# Patient Record
Sex: Male | Born: 1975 | Race: White | Hispanic: No | Smoking: Current every day smoker
Health system: Southern US, Community
[De-identification: ages and names within clinical notes are randomized; demographics above are authoritative.]

## PROBLEM LIST (undated history)

## (undated) DIAGNOSIS — T07XXXA Unspecified multiple injuries, initial encounter: Secondary | ICD-10-CM

## (undated) DIAGNOSIS — I219 Acute myocardial infarction, unspecified: Secondary | ICD-10-CM

## (undated) HISTORY — PX: HAND SURGERY: SHX662

---

## 2013-09-10 ENCOUNTER — Encounter (HOSPITAL_COMMUNITY): Payer: Self-pay | Admitting: Emergency Medicine

## 2013-09-10 ENCOUNTER — Emergency Department (HOSPITAL_COMMUNITY): Payer: Self-pay

## 2013-09-10 ENCOUNTER — Emergency Department (HOSPITAL_COMMUNITY)
Admission: EM | Admit: 2013-09-10 | Discharge: 2013-09-11 | Disposition: A | Discharge status: Home / Self Care | Payer: Self-pay | Attending: Emergency Medicine | Admitting: Emergency Medicine

## 2013-09-10 DIAGNOSIS — S59909A Unspecified injury of unspecified elbow, initial encounter: Secondary | ICD-10-CM | POA: Insufficient documentation

## 2013-09-10 DIAGNOSIS — Y929 Unspecified place or not applicable: Secondary | ICD-10-CM | POA: Insufficient documentation

## 2013-09-10 DIAGNOSIS — M25532 Pain in left wrist: Secondary | ICD-10-CM

## 2013-09-10 DIAGNOSIS — Y9389 Activity, other specified: Secondary | ICD-10-CM | POA: Insufficient documentation

## 2013-09-10 DIAGNOSIS — Z8781 Personal history of (healed) traumatic fracture: Secondary | ICD-10-CM | POA: Insufficient documentation

## 2013-09-10 DIAGNOSIS — S0083XA Contusion of other part of head, initial encounter: Secondary | ICD-10-CM | POA: Insufficient documentation

## 2013-09-10 DIAGNOSIS — IMO0002 Reserved for concepts with insufficient information to code with codable children: Secondary | ICD-10-CM | POA: Insufficient documentation

## 2013-09-10 DIAGNOSIS — S0003XA Contusion of scalp, initial encounter: Secondary | ICD-10-CM | POA: Insufficient documentation

## 2013-09-10 DIAGNOSIS — S1093XA Contusion of unspecified part of neck, initial encounter: Secondary | ICD-10-CM

## 2013-09-10 DIAGNOSIS — I252 Old myocardial infarction: Secondary | ICD-10-CM | POA: Insufficient documentation

## 2013-09-10 DIAGNOSIS — K0889 Other specified disorders of teeth and supporting structures: Secondary | ICD-10-CM | POA: Insufficient documentation

## 2013-09-10 DIAGNOSIS — R634 Abnormal weight loss: Secondary | ICD-10-CM | POA: Insufficient documentation

## 2013-09-10 DIAGNOSIS — S6990XA Unspecified injury of unspecified wrist, hand and finger(s), initial encounter: Principal | ICD-10-CM

## 2013-09-10 DIAGNOSIS — S59919A Unspecified injury of unspecified forearm, initial encounter: Principal | ICD-10-CM

## 2013-09-10 DIAGNOSIS — Z9889 Other specified postprocedural states: Secondary | ICD-10-CM | POA: Insufficient documentation

## 2013-09-10 DIAGNOSIS — Z87828 Personal history of other (healed) physical injury and trauma: Secondary | ICD-10-CM | POA: Insufficient documentation

## 2013-09-10 DIAGNOSIS — F17200 Nicotine dependence, unspecified, uncomplicated: Secondary | ICD-10-CM | POA: Insufficient documentation

## 2013-09-10 HISTORY — DX: Unspecified multiple injuries, initial encounter: T07.XXXA

## 2013-09-10 HISTORY — DX: Acute myocardial infarction, unspecified: I21.9
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
## 2013-09-11 ENCOUNTER — Emergency Department (HOSPITAL_COMMUNITY): Payer: Self-pay

## 2013-09-11 LAB — CBC WITH DIFFERENTIAL/PLATELET
BASOS ABS: 0.1 10*3/uL (ref 0.0–0.1)
Basophils Relative: 1 % (ref 0–1)
Eosinophils Absolute: 0.7 10*3/uL (ref 0.0–0.7)
Eosinophils Relative: 8 % — ABNORMAL HIGH (ref 0–5)
HEMATOCRIT: 39.5 % (ref 39.0–52.0)
HEMOGLOBIN: 14 g/dL (ref 13.0–17.0)
LYMPHS PCT: 36 % (ref 12–46)
Lymphs Abs: 3 10*3/uL (ref 0.7–4.0)
MCH: 32.9 pg (ref 26.0–34.0)
MCHC: 35.4 g/dL (ref 30.0–36.0)
MCV: 92.7 fL (ref 78.0–100.0)
MONO ABS: 0.6 10*3/uL (ref 0.1–1.0)
Monocytes Relative: 8 % (ref 3–12)
NEUTROS ABS: 4 10*3/uL (ref 1.7–7.7)
Neutrophils Relative %: 47 % (ref 43–77)
Platelets: 252 10*3/uL (ref 150–400)
RBC: 4.26 MIL/uL (ref 4.22–5.81)
RDW: 12.1 % (ref 11.5–15.5)
WBC: 8.4 10*3/uL (ref 4.0–10.5)

## 2013-09-11 LAB — BASIC METABOLIC PANEL
ANION GAP: 12 (ref 5–15)
BUN: 9 mg/dL (ref 6–23)
CO2: 29 meq/L (ref 19–32)
CREATININE: 0.77 mg/dL (ref 0.50–1.35)
Calcium: 9 mg/dL (ref 8.4–10.5)
Chloride: 101 mEq/L (ref 96–112)
GFR calc Af Amer: >90 mL/min (ref >90)
GFR calc non Af Amer: >90 mL/min (ref >90)
Glucose, Bld: 87 mg/dL (ref 70–99)
Potassium: 4 mEq/L (ref 3.7–5.3)
Sodium: 142 mEq/L (ref 137–147)

## 2013-09-11 MED ORDER — HYDROCODONE-ACETAMINOPHEN 5-325 MG PO TABS
1.0000 | ORAL_TABLET | Freq: Once | ORAL | Status: AC
  Administered 2013-09-11: 1 via ORAL
  Filled 2013-09-11: qty 1

## 2013-09-11 MED ORDER — HYDROCODONE-ACETAMINOPHEN 5-325 MG PO TABS: ORAL_TABLET | ORAL | Status: DC

## 2013-09-11 NOTE — ED Provider Notes (Signed)
Medical screening examination/treatment/procedure(s) were performed by non-physician practitioner and as supervising physician I was immediately available for consultation/collaboration.   EKG Interpretation None        Mercades Bajaj L Perfecto Purdy, MD 09/11/13 0223 

## 2013-09-11 NOTE — Discharge Instructions (Signed)
Do not hesitate to return to the emergency room for any new, worsening or concerning symptoms. ° °Please obtain primary care using resource guide below. But the minute you were seen in the emergency room and that they will need to obtain records for further outpatient management. ° ° ° °Emergency Department Resource Guide °1) Find a Doctor and Pay Out of Pocket °Although you won't have to find out who is covered by your insurance plan, it is a good idea to ask around and get recommendations. You will then need to call the office and see if the doctor you have chosen will accept you as a new patient and what types of options they offer for patients who are self-pay. Some doctors offer discounts or will set up payment plans for their patients who do not have insurance, but you will need to ask so you aren't surprised when you get to your appointment. ° °2) Contact Your Local Health Department °Not all health departments have doctors that can see patients for sick visits, but many do, so it is worth a call to see if yours does. If you don't know where your local health department is, you can check in your phone book. The CDC also has a tool to help you locate your state's health department, and many state websites also have listings of all of their local health departments. ° °3) Find a Walk-in Clinic °If your illness is not likely to be very severe or complicated, you may want to try a walk in clinic. These are popping up all over the country in pharmacies, drugstores, and shopping centers. They're usually staffed by nurse practitioners or physician assistants that have been trained to treat common illnesses and complaints. They're usually fairly quick and inexpensive. However, if you have serious medical issues or chronic medical problems, these are probably not your best option. ° °No Primary Care Doctor: °- Call [facility] at  832-8000 - they can help you locate a primary care doctor that  accepts your  insurance, provides certain services, etc. °- Physician Referral Service- 1-800-533-3463 ° °Chronic Pain Problems: °Organization         Address  Phone   Notes  °[facility]  (336) 297-2271 Patients need to be referred by their primary care doctor.  ° °Medication Assistance: °Organization         Address  Phone   Notes  °[place] [facility] [address] °[place], [place] [address] (336) 641-8030 --Must be a resident of [place] °-- Must have NO insurance coverage whatsoever (no Medicaid/ Medicare, etc.) °-- The pt. MUST have a primary care doctor that directs their care regularly and follows them in the community °  °[facility]  (866) 331-1348   °[employer]  (888) 892-1162   ° °Agencies that provide inexpensive medical care: °Organization         Address  Phone   Notes  °[facility]  (336) 832-8035   °[facility]    (336) 832-7272   °[facility] [address] °[place], [place] [address] (336) 832-4777   °[facility] [address], °[place] (336) 271-4999   °[facility]    (336) 373-0678   °[facility]    (336) 272-1050   °[facility] ° [address], [place] Phone:  (336) 832-4444, Fax:  (336) 832-4440 Hours of Operation:  9 am -   6 pm, M-F.  Also accepts Medicaid/Medicare and self-pay.  °[facility] ° [address], [place] Phone: (336) 832-3150, Fax: (336) 832-3151. Hours of Operation:  8:30 am - 5:30 pm, M-F.  Also accepts Medicaid and self-pay.  °[facility] [place] [address], [place] Phone: (336) 878-6027   °[facility] [address], [place], [place] (336)723-1848, Ext. 123 Mondays & Thursdays: 7-9 AM.  First 15 patients are seen on a first come, first serve basis. °  ° °Medicaid-accepting [place] Providers: ° °Organization          Address  Phone   Notes  °[facility] [address], [place] (336) 641-2100 Also accepts self-pay patients.  °[facility] [address], [place] ° (336) 856-9996   °[facility] [address], [place] (336) 288-8857   °[facility] [address] [place] [address], [place] (336) 299-7000   °Veita Bland [address], [place]  ° (336) 373-1557 Only accepts [place] Access Medicaid patients after they have their name applied to their card.  ° °Self-Pay (no insurance) in [place]: ° °Organization         Address  Phone   Notes  °Sickle Cell Patients, [facility] [address], [place] (336) 832-1970   °[facility] [address], [place] (336) 832-4400   °[facility] ° [address], [place] (336) 992-4800   °[facility]/Dr. Osei-Bonsu ° [address] [place] [address], [place] or [address], [place] (336) 841-8500 Phone number for both [place] and [place] locations is the same.  °[facility] [address], [place] (336) 299-0000   °[facility] [address] [place] [address], [place] or [address] (336) 852-7530 °(336) 878-2260   °[facility] [address], [place] (336) 350-1642, phone; (336) 294-5005, fax Sees patients 1st and 3rd Saturday of every month.  Must not qualify for public or private insurance (i.e. Medicaid, Medicare, [place] Health Choice, Veterans' Benefits) • Household income should be no more than 200% of the poverty level •The clinic cannot treat you if you are pregnant or think you are pregnant • Sexually transmitted diseases are not treated at the clinic.  ° ° °Dental Care: °Organization         Address  Phone  Notes  °[facility] [address],  [place] (336) 641-6152 Accepts children up to age 21 who are enrolled in Medicaid or [place] Health Choice; pregnant women with a Medicaid card; and children who have applied for Medicaid or [place] Health Choice, but were declined, whose parents can pay a reduced fee at time of service.  °[facility] [place]  [address], [place] (336) 641-7733 Accepts children up to age 21 who are enrolled in Medicaid or [place] Health Choice; pregnant women with a Medicaid card; and children who have applied for Medicaid or [place] Health Choice, but were declined, whose parents can pay a reduced fee at time of service.  °[facility] ° [address], [place] (336) 641-4533 Patients are seen by appointment only. Walk-ins are not accepted. [facility] will see patients 18 years of age and   older. °Monday - Tuesday (8am-5pm) °Most Wednesdays (8:30-5pm) °$30 per visit, cash only  °[facility] ° [address], [place] (336) 641-4533 Patients are seen by appointment only. Walk-ins are not accepted. [facility] will see patients 18 years of age and older. °One Wednesday Evening (Monthly: Volunteer Based).  $30 per visit, cash only  °[facility]  (919) 537-3737 for adults; Children under age 4, call Graduate Pediatric Dentistry at (919) [phone]. Children aged 4-14, please call (919) [phone] to request a pediatric application. ° Dental services are provided in all areas of dental care including fillings, crowns and bridges, complete and partial dentures, implants, gum treatment, root canals, and extractions. Preventive care is also provided. Treatment is provided to both adults and children. °Patients are selected via a lottery and there is often a waiting list. °  °[facility] [address], °[place] ° (336) 763-8833 [email or web address] °  °[facility] [address], [place], [place]  (336)723-1848, Ext. 123 Second and Fourth Thursday of each month, opens at 6:30 AM; Clinic ends at 9 AM.  Patients are seen on a first-come first-served basis, and a limited number are seen during each clinic.  ° °[facility] ° [address], [place], [place] (336) 723-7904   Eligibility Requirements °You must have lived in [place], [place], or [place] counties for at least the last three months. °  You cannot be eligible for state or federal sponsored healthcare insurance, including Veterans Administration, Medicaid, or Medicare. °  You generally cannot be eligible for healthcare insurance through your employer.  °  How to apply: °Eligibility screenings are held every Tuesday and Wednesday afternoon from 1:00 pm until 4:00 pm. You do not need an appointment for the interview!  °[facility] [address], [place], [place] 336-631-2330   °[facility]  336-342-8273   °[facility]  336-703-3100   °[facility]  336-570-6415   ° °Behavioral Health Resources in the Community: °Intensive Outpatient Programs °Organization         Address  Phone  Notes  °[facility] [address], [place], [place] 336-878-6098   °[facility] [address], [place], [place] 336-832-9800   °[facility] [address], [place], [place] ° 336-882-2125   °[facility] [address],  °[place], [place] 1-800-853-5163 or 336-641-4981   °Substance Abuse Resources °Organization         Address  Phone  Notes  °[facility]  336-882-2125   °[facility]  336-784-9470   °[facility]  336-285-9073   °[facility]  336-845-3988   °Residential & Outpatient Substance Abuse Program  1-800-659-3381   °Psychological Services °Organization         Address  Phone  Notes  °[facility]  336- 832-9600   °[facility]  336- 378-7881    °[facility] [address], [place] 1-800-853-5163 or 336-641-4981   ° °Mobile Crisis Teams °Organization         Address  Phone  Notes  °[facility], Mobile Crisis Care Unit  1-877-626-1772   °Assertive °[facility] ° [address] [place], [place] 336-834-9664   °Sharon DeEsch [address] °[place] 336-554-5454   ° °Self-Help/Support Groups °Organization         Address    Phone             Notes  °[facility] [place] - variety of support groups  336- 373-1402 Call for more information  °Narcotics Anonymous (NA), [facility] [address], °[place]  2 meetings at this location  ° °Residential Treatment Programs °Organization         Address  Phone  Notes  °[facility] [address],    °[place]  1-866-801-8205   °[facility] ° [address], [place], [place] 704-293-8524   °[facility] [address], [place] 336-845-3988 Admissions: 8am-3pm M-F  °[facility] [address]    °[place], [place] 336-841-1104   °[facility] [address], [place], [place] 336-379-7146   °[facility] [address]  °[place], [place] 336-285-9073   °[facility] Intensive Outpatient [address], [place], [place] 336-852-3033   °[facility] [address]  °[place], [place] 1-877-615-2722 or 336-784-9470   °[facility] ([facility] [address] [place], [place] 336-227-7417 Accepts Medicaid  °[facility] [address]  °[place] 1-800-659-3381 Substance Abuse/Addiction Treatment  ° °[place] Behavioral Health Resources °Organization         Address  Phone  Notes  °[facility]  (888) 581-9988   °Julie Brannon, PhD [address] [place], [place]   (336) 349-5553 or (336) 951-0000   °[facility]   [address] °[place], [place] (336) 349-4454   °[facility] [address], [place], [place] (336) 342-8316 Insurance/Medicaid/sponsorship through [facility]  °[facility] [address]                                    [place], [place] (336) 342-8316 Therapy/tele-psych/case  °[facility] [address]  ° [place], [place] (336) 349-2233    °Dr. Arfeen  (336) 349-4544   °[facility] [place]  [employer] [place] [facility] 1) [address], [place] °2) [address], [place] °3)  [address], [place] (336) 349-3220 °(336) 342-7768 ° °(336) 342-8140   °[place] [facility] (336) 342-1394 or (336) 342-3537 (After Hours)    ° ° ° °

## 2014-02-11 ENCOUNTER — Emergency Department (HOSPITAL_COMMUNITY): Payer: Self-pay

## 2014-02-11 ENCOUNTER — Emergency Department (HOSPITAL_COMMUNITY)
Admission: EM | Admit: 2014-02-11 | Discharge: 2014-02-11 | Disposition: A | Discharge status: Home / Self Care | Payer: Self-pay | Attending: Emergency Medicine | Admitting: Emergency Medicine

## 2014-02-11 ENCOUNTER — Encounter (HOSPITAL_COMMUNITY): Payer: Self-pay | Admitting: *Deleted

## 2014-02-11 DIAGNOSIS — I252 Old myocardial infarction: Secondary | ICD-10-CM | POA: Insufficient documentation

## 2014-02-11 DIAGNOSIS — T1490XA Injury, unspecified, initial encounter: Secondary | ICD-10-CM

## 2014-02-11 DIAGNOSIS — R52 Pain, unspecified: Secondary | ICD-10-CM

## 2014-02-11 DIAGNOSIS — S92415A Nondisplaced fracture of proximal phalanx of left great toe, initial encounter for closed fracture: Secondary | ICD-10-CM | POA: Insufficient documentation

## 2014-02-11 DIAGNOSIS — S92402A Displaced unspecified fracture of left great toe, initial encounter for closed fracture: Secondary | ICD-10-CM

## 2014-02-11 DIAGNOSIS — Y9389 Activity, other specified: Secondary | ICD-10-CM | POA: Insufficient documentation

## 2014-02-11 DIAGNOSIS — Y9302 Activity, running: Secondary | ICD-10-CM | POA: Insufficient documentation

## 2014-02-11 DIAGNOSIS — Z79899 Other long term (current) drug therapy: Secondary | ICD-10-CM | POA: Insufficient documentation

## 2014-02-11 DIAGNOSIS — Y9289 Other specified places as the place of occurrence of the external cause: Secondary | ICD-10-CM | POA: Insufficient documentation

## 2014-02-11 DIAGNOSIS — Y998 Other external cause status: Secondary | ICD-10-CM | POA: Insufficient documentation

## 2014-02-11 DIAGNOSIS — Z720 Tobacco use: Secondary | ICD-10-CM | POA: Insufficient documentation

## 2014-02-11 DIAGNOSIS — W228XXA Striking against or struck by other objects, initial encounter: Secondary | ICD-10-CM | POA: Insufficient documentation

## 2014-02-11 MED ORDER — OXYCODONE-ACETAMINOPHEN 5-325 MG PO TABS
2.0000 | ORAL_TABLET | Freq: Once | ORAL | Status: AC
  Administered 2014-02-11: 2 via ORAL
  Filled 2014-02-11: qty 2

## 2014-02-11 MED ORDER — HYDROCODONE-ACETAMINOPHEN 5-325 MG PO TABS: ORAL_TABLET | ORAL | Status: DC

## 2014-02-11 MED ORDER — MELOXICAM 7.5 MG PO TABS: 7.5000 mg | ORAL_TABLET | Freq: Two times a day (BID) | ORAL | Status: AC | PRN

## 2014-02-11 NOTE — ED Notes (Signed)
Offered ice bag but pt refuses ice, states that he tried yesterday and it made pain worse
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
## 2014-02-11 NOTE — ED Notes (Signed)
Pt states girlfriend will drive pt home at discharge

## 2014-02-11 NOTE — ED Notes (Signed)
Pt verbalized understanding of no driving and to use caution within 4 hours of taking pain meds due to meds cause drowsiness 

## 2014-02-11 NOTE — ED Notes (Signed)
Injury to left great toe after running and hitting it on a stepping stone last night.

## 2014-03-07 ENCOUNTER — Encounter (HOSPITAL_COMMUNITY): Payer: Self-pay | Admitting: Emergency Medicine

## 2014-03-07 ENCOUNTER — Emergency Department (HOSPITAL_COMMUNITY)
Admission: EM | Admit: 2014-03-07 | Discharge: 2014-03-07 | Disposition: A | Discharge status: Home / Self Care | Payer: Self-pay | Attending: Emergency Medicine | Admitting: Emergency Medicine

## 2014-03-07 DIAGNOSIS — Z77098 Contact with and (suspected) exposure to other hazardous, chiefly nonmedicinal, chemicals: Secondary | ICD-10-CM

## 2014-03-07 DIAGNOSIS — Y9389 Activity, other specified: Secondary | ICD-10-CM | POA: Insufficient documentation

## 2014-03-07 DIAGNOSIS — T5494XA Toxic effect of unspecified corrosive substance, undetermined, initial encounter: Secondary | ICD-10-CM | POA: Insufficient documentation

## 2014-03-07 DIAGNOSIS — M549 Dorsalgia, unspecified: Secondary | ICD-10-CM | POA: Insufficient documentation

## 2014-03-07 DIAGNOSIS — Z720 Tobacco use: Secondary | ICD-10-CM | POA: Insufficient documentation

## 2014-03-07 DIAGNOSIS — Z8781 Personal history of (healed) traumatic fracture: Secondary | ICD-10-CM | POA: Insufficient documentation

## 2014-03-07 DIAGNOSIS — X58XXXA Exposure to other specified factors, initial encounter: Secondary | ICD-10-CM | POA: Insufficient documentation

## 2014-03-07 DIAGNOSIS — Y9289 Other specified places as the place of occurrence of the external cause: Secondary | ICD-10-CM | POA: Insufficient documentation

## 2014-03-07 DIAGNOSIS — T2662XA Corrosion of cornea and conjunctival sac, left eye, initial encounter: Secondary | ICD-10-CM | POA: Insufficient documentation

## 2014-03-07 DIAGNOSIS — T2661XA Corrosion of cornea and conjunctival sac, right eye, initial encounter: Secondary | ICD-10-CM | POA: Insufficient documentation

## 2014-03-07 DIAGNOSIS — Z791 Long term (current) use of non-steroidal anti-inflammatories (NSAID): Secondary | ICD-10-CM | POA: Insufficient documentation

## 2014-03-07 DIAGNOSIS — I252 Old myocardial infarction: Secondary | ICD-10-CM | POA: Insufficient documentation

## 2014-03-07 DIAGNOSIS — Y998 Other external cause status: Secondary | ICD-10-CM | POA: Insufficient documentation

## 2014-03-07 MED ORDER — FLUORESCEIN SODIUM 1 MG OP STRP
1.0000 | ORAL_STRIP | Freq: Once | OPHTHALMIC | Status: AC
  Administered 2014-03-07: 1 via OPHTHALMIC

## 2014-03-07 MED ORDER — NAPROXEN 500 MG PO TABS: 500.0000 mg | ORAL_TABLET | Freq: Two times a day (BID) | ORAL | Status: AC

## 2014-03-07 MED ORDER — HYDROCODONE-ACETAMINOPHEN 5-325 MG PO TABS: 1.0000 | ORAL_TABLET | Freq: Four times a day (QID) | ORAL | Status: AC | PRN

## 2014-03-07 MED ORDER — HYDROCODONE-ACETAMINOPHEN 5-325 MG PO TABS
1.0000 | ORAL_TABLET | Freq: Once | ORAL | Status: AC
Start: 2014-03-07 — End: 2014-03-07
  Administered 2014-03-07: 1 via ORAL
  Filled 2014-03-07: qty 1

## 2014-03-07 MED ORDER — TETRACAINE HCL 0.5 % OP SOLN
OPHTHALMIC | Status: AC
  Administered 2014-03-07: 2 [drp] via OPHTHALMIC
  Filled 2014-03-07: qty 2

## 2014-03-07 MED ORDER — FLUORESCEIN SODIUM 1 MG OP STRP
ORAL_STRIP | OPHTHALMIC | Status: AC
  Administered 2014-03-07: 1 via OPHTHALMIC
  Filled 2014-03-07: qty 1

## 2014-03-07 MED ORDER — TETRACAINE HCL 0.5 % OP SOLN
2.0000 [drp] | Freq: Once | OPHTHALMIC | Status: AC
  Administered 2014-03-07: 2 [drp] via OPHTHALMIC
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
## 2014-03-07 NOTE — ED Notes (Signed)
Patient given discharge instruction, verbalized understand. Patient ambulatory out of the department to waiting to wait on employee to pick him up

## 2014-03-07 NOTE — Discharge Instructions (Signed)
Workup for the eye without any significant injuries. Discussed with poison control. Chemical suggest be an irritant which and cause any long-term problems. Work note provided. Take the Naprosyn on a regular basis. Supplement with hydrocodone as needed for pain. Return for any new or worse symptoms or if not improving.
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
## 2014-05-03 ENCOUNTER — Ambulatory Visit (HOSPITAL_COMMUNITY)
Admission: RE | Admit: 2014-05-03 | Discharge: 2014-05-03 | Disposition: A | Discharge status: Home / Self Care | Payer: Self-pay | Source: Ambulatory Visit | Attending: Physician Assistant | Admitting: Physician Assistant

## 2014-05-03 ENCOUNTER — Other Ambulatory Visit (HOSPITAL_COMMUNITY): Payer: Self-pay | Admitting: Physician Assistant

## 2014-05-03 DIAGNOSIS — R634 Abnormal weight loss: Secondary | ICD-10-CM

## 2014-05-03 DIAGNOSIS — F17200 Nicotine dependence, unspecified, uncomplicated: Secondary | ICD-10-CM | POA: Insufficient documentation

## 2014-05-03 DIAGNOSIS — R222 Localized swelling, mass and lump, trunk: Secondary | ICD-10-CM

## 2014-05-03 DIAGNOSIS — J449 Chronic obstructive pulmonary disease, unspecified: Secondary | ICD-10-CM | POA: Insufficient documentation

## 2016-12-30 ENCOUNTER — Emergency Department (HOSPITAL_COMMUNITY): Payer: Self-pay

## 2016-12-30 ENCOUNTER — Encounter (HOSPITAL_COMMUNITY): Payer: Self-pay | Admitting: Emergency Medicine

## 2016-12-30 ENCOUNTER — Other Ambulatory Visit: Payer: Self-pay

## 2016-12-30 ENCOUNTER — Emergency Department (HOSPITAL_COMMUNITY)
Admission: EM | Admit: 2016-12-30 | Discharge: 2016-12-30 | Disposition: A | Discharge status: Home / Self Care | Payer: Self-pay | Attending: Emergency Medicine | Admitting: Emergency Medicine

## 2016-12-30 DIAGNOSIS — Z79899 Other long term (current) drug therapy: Secondary | ICD-10-CM | POA: Insufficient documentation

## 2016-12-30 DIAGNOSIS — I252 Old myocardial infarction: Secondary | ICD-10-CM | POA: Insufficient documentation

## 2016-12-30 DIAGNOSIS — M48061 Spinal stenosis, lumbar region without neurogenic claudication: Secondary | ICD-10-CM | POA: Insufficient documentation

## 2016-12-30 DIAGNOSIS — F17210 Nicotine dependence, cigarettes, uncomplicated: Secondary | ICD-10-CM | POA: Insufficient documentation

## 2016-12-30 DIAGNOSIS — M62830 Muscle spasm of back: Secondary | ICD-10-CM | POA: Insufficient documentation

## 2016-12-30 MED ORDER — HYDROCODONE-ACETAMINOPHEN 5-325 MG PO TABS
2.0000 | ORAL_TABLET | Freq: Once | ORAL | Status: AC
Start: 2016-12-30 — End: 2016-12-30
  Administered 2016-12-30: 2 via ORAL
  Filled 2016-12-30: qty 2

## 2016-12-30 MED ORDER — ONDANSETRON HCL 4 MG PO TABS
4.0000 mg | ORAL_TABLET | Freq: Once | ORAL | Status: AC
  Administered 2016-12-30: 4 mg via ORAL
  Filled 2016-12-30: qty 1

## 2016-12-30 MED ORDER — CYCLOBENZAPRINE HCL 10 MG PO TABS: 10.0000 mg | ORAL_TABLET | Freq: Three times a day (TID) | ORAL | 0 refills | Status: AC

## 2016-12-30 MED ORDER — IBUPROFEN 800 MG PO TABS
800.0000 mg | ORAL_TABLET | Freq: Once | ORAL | Status: AC
  Administered 2016-12-30: 800 mg via ORAL
  Filled 2016-12-30: qty 1

## 2016-12-30 MED ORDER — DIAZEPAM 5 MG PO TABS
10.0000 mg | ORAL_TABLET | Freq: Once | ORAL | Status: AC
  Administered 2016-12-30: 10 mg via ORAL
  Filled 2016-12-30: qty 2

## 2016-12-30 NOTE — ED Triage Notes (Signed)
Pt c/o lower back pain since yesterday and loading firewood. Pt c/o bilateral leg numbness at times.
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
## 2016-12-30 NOTE — Discharge Instructions (Signed)
Your vital signs are nonacute.  There are no gross neurologic deficits appreciated on your examination at this time.  The CT scan of your lumbar spine is negative for fracture or any malalignment.  There is a mild disc bulge at the L5-S1 area causing some mild foraminal stenosis.  I suspect that your symptoms are related to muscle strain involving your lower back area.  Please use a heating pad to the area.  Please be cautious not to overuse or burning herself.  Use Flexeril 3 times daily for spasm pain.  This medication may cause drowsiness.  Please do not drive, drink, operate machinery, or participate in activities requiring concentration when taking this medication.  Please use 600 mg of ibuprofen with breakfast, lunch, dinner, and at bedtime over the next few days for inflammation and pain.  Please see Dr. Romeo AppleHarrison for orthopedic evaluation if your symptoms are not resolving.

## 2017-05-11 ENCOUNTER — Other Ambulatory Visit: Payer: Self-pay

## 2017-05-11 ENCOUNTER — Emergency Department (HOSPITAL_COMMUNITY): Payer: Self-pay

## 2017-05-11 ENCOUNTER — Emergency Department (HOSPITAL_COMMUNITY)
Admission: EM | Admit: 2017-05-11 | Discharge: 2017-05-11 | Disposition: A | Discharge status: Home / Self Care | Payer: Self-pay | Attending: Emergency Medicine | Admitting: Emergency Medicine

## 2017-05-11 ENCOUNTER — Encounter (HOSPITAL_COMMUNITY): Payer: Self-pay | Admitting: *Deleted

## 2017-05-11 DIAGNOSIS — M546 Pain in thoracic spine: Secondary | ICD-10-CM

## 2017-05-11 DIAGNOSIS — M549 Dorsalgia, unspecified: Secondary | ICD-10-CM | POA: Insufficient documentation

## 2017-05-11 DIAGNOSIS — R52 Pain, unspecified: Secondary | ICD-10-CM

## 2017-05-11 DIAGNOSIS — F17210 Nicotine dependence, cigarettes, uncomplicated: Secondary | ICD-10-CM | POA: Insufficient documentation

## 2017-05-11 MED ORDER — CYCLOBENZAPRINE HCL 10 MG PO TABS
5.0000 mg | ORAL_TABLET | Freq: Once | ORAL | Status: AC
  Administered 2017-05-11: 5 mg via ORAL
  Filled 2017-05-11: qty 1

## 2017-05-11 MED ORDER — KETOROLAC TROMETHAMINE 30 MG/ML IJ SOLN
30.0000 mg | Freq: Once | INTRAMUSCULAR | Status: AC
  Administered 2017-05-11: 30 mg via INTRAMUSCULAR
  Filled 2017-05-11: qty 1
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
## 2017-05-11 NOTE — ED Triage Notes (Signed)
Pt c/o pain in between shoulder blades x 4 days; pt states he heard a "pop" sound when the pain started; pt states he can not get comfortable

## 2017-05-11 NOTE — ED Notes (Signed)
Patient put on his jacket and walked out of the room and stated "I can go home and hurt like this", I asked him if he wanted to stay and wait for his x-ray results or sign, he said no, EDP Horton informed.

## 2019-02-10 IMAGING — DX DG THORACIC SPINE 3V
3 series · 4 of 4 positions shown · non-contrast
Comparison: Chest x-ray 05/03/2014, 09/11/2013

CLINICAL DATA: Thoracic pain

EXAM:
THORACIC SPINE - 3 VIEWS

[Series 1: t-spine ap · 0.14mm/px · 2 of 2 slices shown]
[im 1/2]
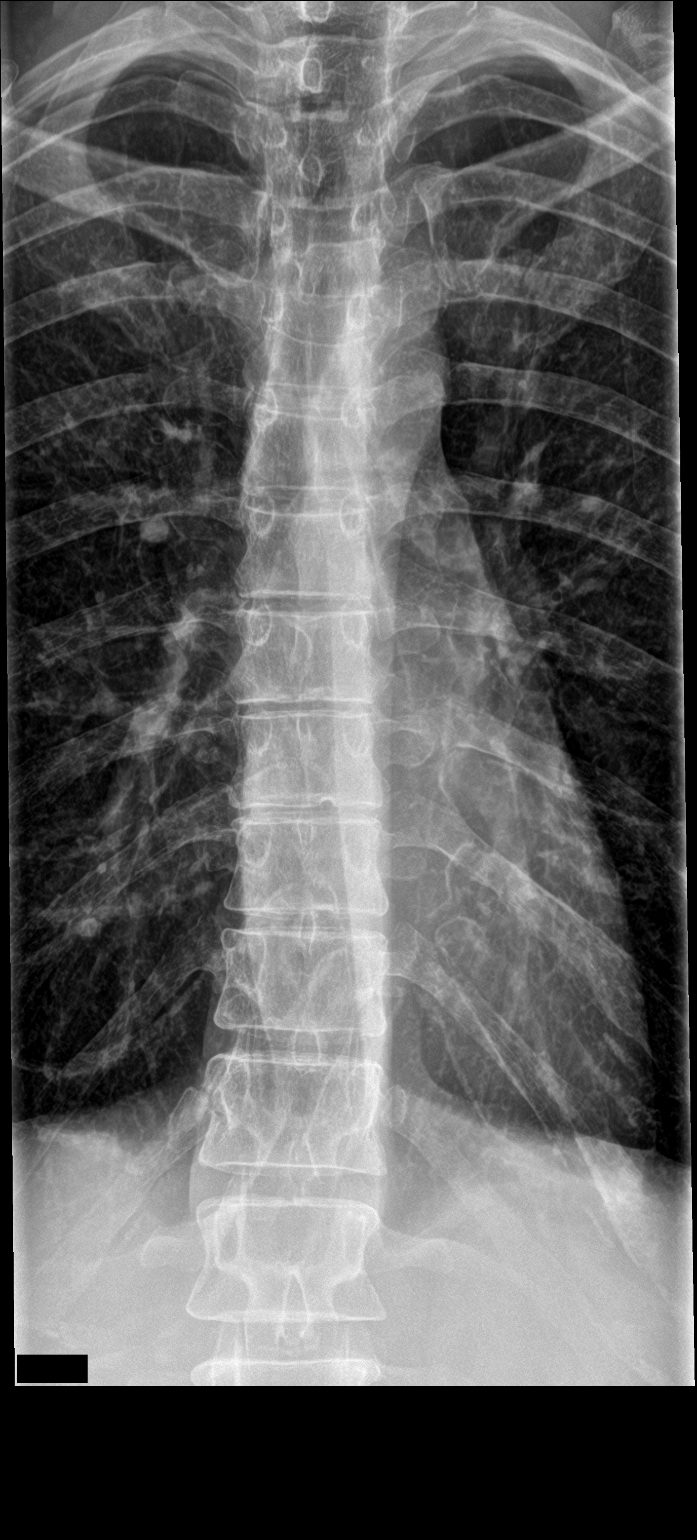
[im 2/2]
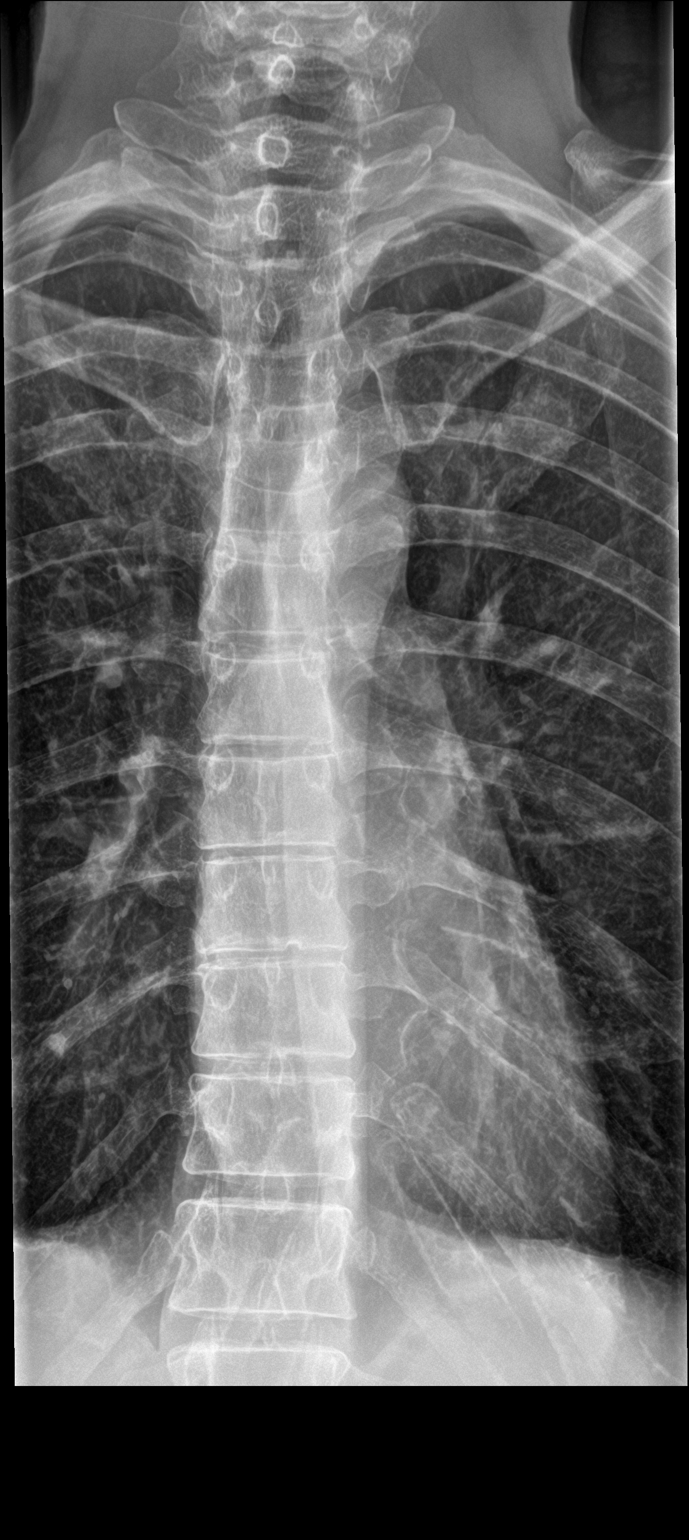

[t-spine lat]
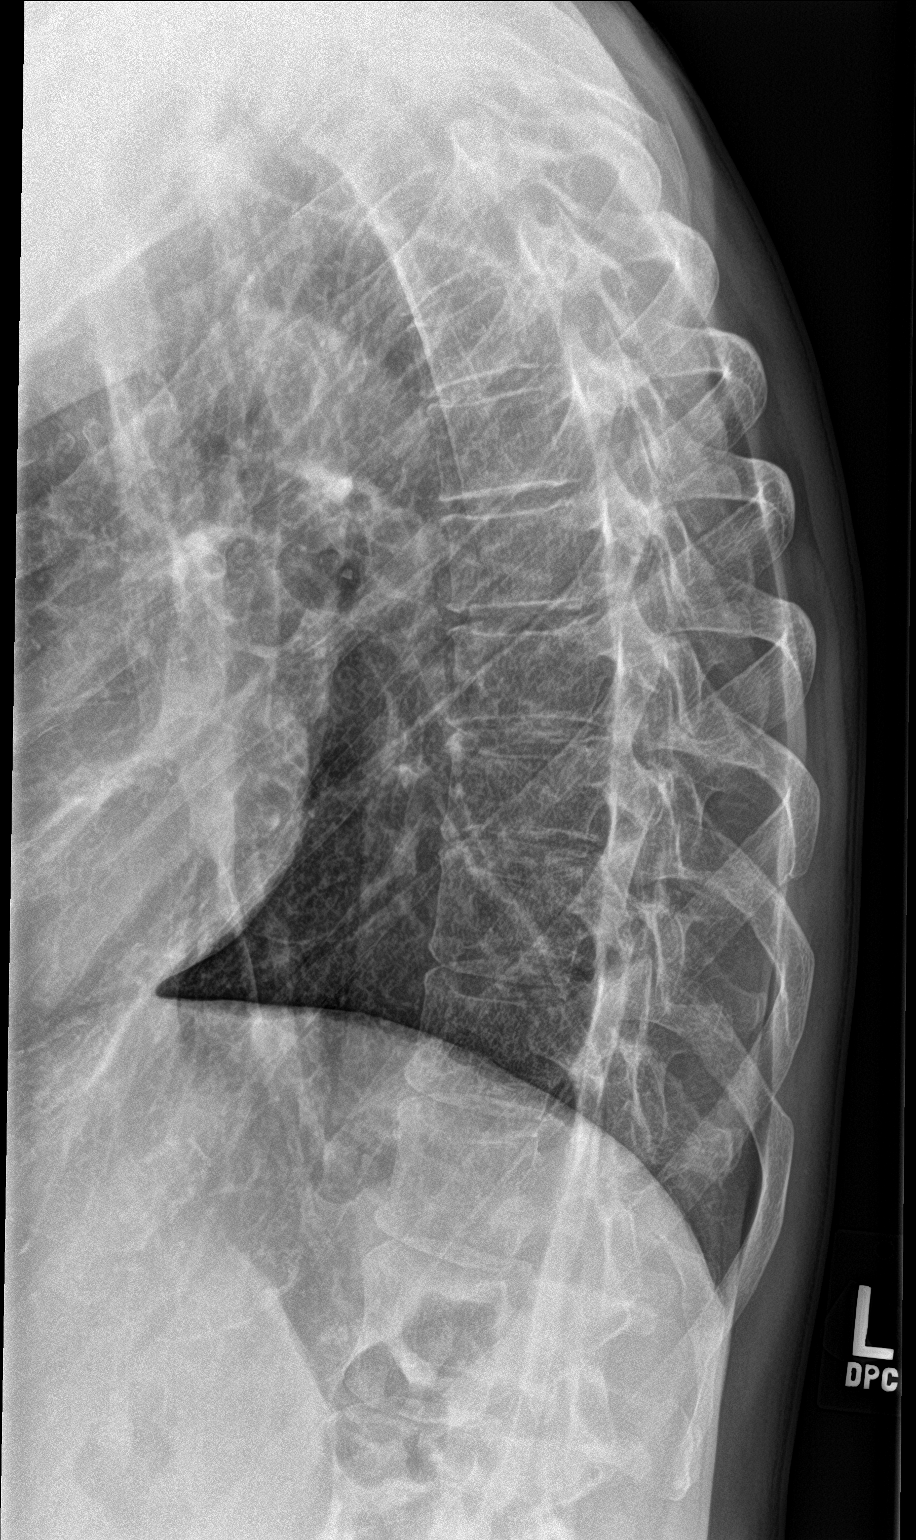

[t-spine swimmers]
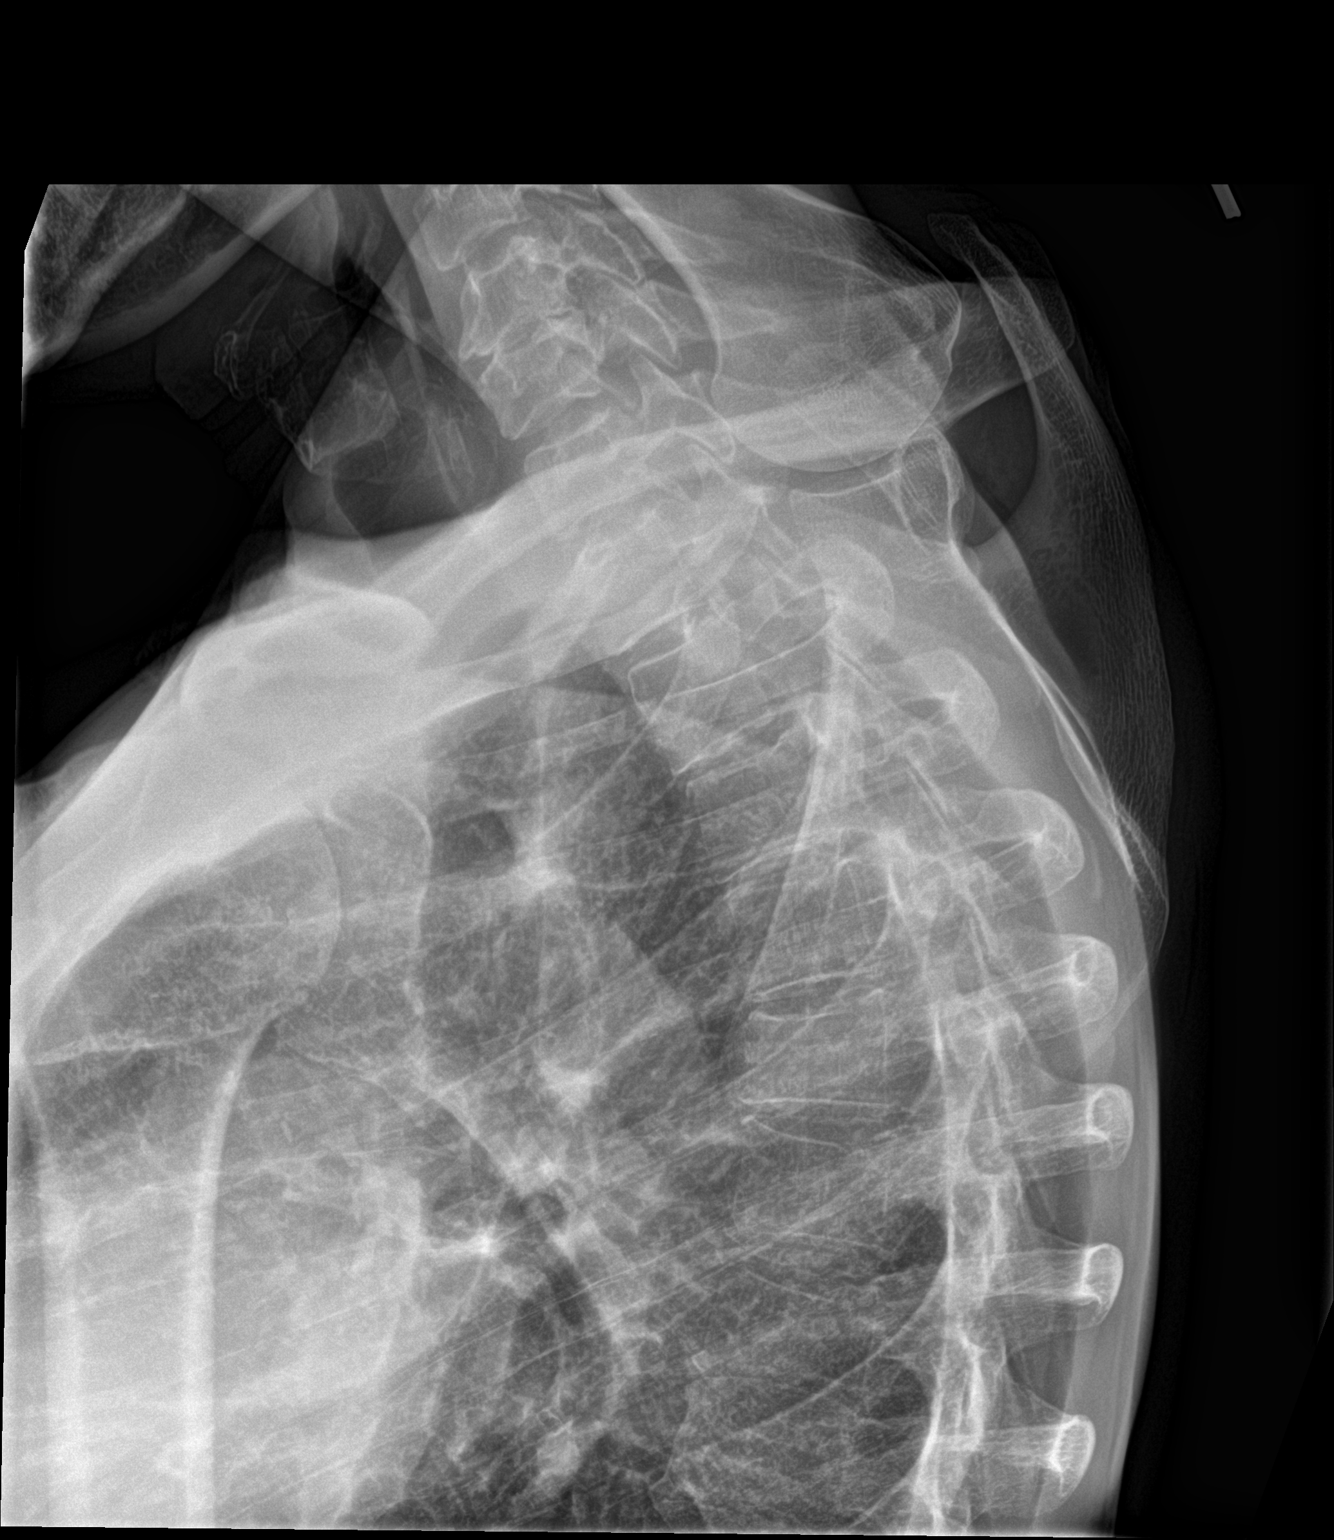

[4 of 4 positions shown; findings below may reference images not displayed]

FINDINGS: Thoracic alignment is within normal limits. Chronic compression of
T5. Disc spaces are normal
IMPRESSION: No acute osseous abnormality

## 2019-11-27 DEATH — deceased
